# Patient Record
Sex: Male | Born: 1990 | Race: White | Hispanic: No | Marital: Single | Smoking: Never smoker
Health system: Southern US, Community
[De-identification: ages and names within clinical notes are randomized; demographics above are authoritative.]

## PROBLEM LIST (undated history)

## (undated) DIAGNOSIS — F909 Attention-deficit hyperactivity disorder, unspecified type: Secondary | ICD-10-CM

## (undated) DIAGNOSIS — F32A Depression, unspecified: Secondary | ICD-10-CM

---
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
## 2013-05-26 ENCOUNTER — Emergency Department: Payer: Self-pay | Admitting: Emergency Medicine

## 2014-03-14 ENCOUNTER — Ambulatory Visit: Payer: Self-pay | Admitting: Internal Medicine

## 2014-09-17 ENCOUNTER — Ambulatory Visit
Admit: 2014-09-17 | Disposition: A | Discharge status: Home / Self Care | Payer: Self-pay | Attending: Family Medicine | Admitting: Family Medicine

## 2020-07-29 ENCOUNTER — Other Ambulatory Visit: Payer: Self-pay

## 2020-07-29 ENCOUNTER — Ambulatory Visit (INDEPENDENT_AMBULATORY_CARE_PROVIDER_SITE_OTHER): Payer: Self-pay

## 2020-07-29 ENCOUNTER — Encounter: Payer: Self-pay | Admitting: Emergency Medicine

## 2020-07-29 ENCOUNTER — Ambulatory Visit
Admission: EM | Admit: 2020-07-29 | Discharge: 2020-07-29 | Disposition: A | Discharge status: Home / Self Care | Payer: Self-pay | Attending: Internal Medicine | Admitting: Internal Medicine

## 2020-07-29 DIAGNOSIS — R058 Other specified cough: Secondary | ICD-10-CM

## 2020-07-29 HISTORY — DX: Depression, unspecified: F32.A

## 2020-07-29 HISTORY — DX: Attention-deficit hyperactivity disorder, unspecified type: F90.9

## 2020-07-29 MED ORDER — IBUPROFEN 600 MG PO TABS
600.0000 mg | ORAL_TABLET | Freq: Four times a day (QID) | ORAL | 0 refills | Status: AC | PRN
Start: 2020-07-29 — End: ?

## 2020-07-29 MED ORDER — BENZONATATE 100 MG PO CAPS: 100.0000 mg | ORAL_CAPSULE | Freq: Three times a day (TID) | ORAL | 0 refills | Status: DC

## 2020-07-29 MED ORDER — PREDNISONE 10 MG PO TABS: 20.0000 mg | ORAL_TABLET | Freq: Every day | ORAL | 0 refills | Status: AC

## 2020-07-29 NOTE — ED Triage Notes (Signed)
Patient c/o cough and chest congestion for the past 4 weeks.  Patient states that he is getting more SOB.  Patient denies fevers.

## 2020-07-29 NOTE — ED Provider Notes (Signed)
[facility]    CSN: [id number] Arrival date & time: 07/29/20  1434      History   Chief Complaint Chief Complaint  Patient presents with  . Cough    HPI Trevor Grant is a 30 y.o. male comes to the urgent care with complaints of nonproductive cough, chest pain and some shortness of breath over the past 4 weeks.  Patient had an episode of upper respiratory infection symptoms about 4 weeks ago.  He tested negative for COVID-19 infection.  Since then patient has had persistent cough which is largely nonproductive.  Cough is associated with chest tightness and some shortness of breath on exertion.  No fever or chills.  He has not tried any over-the-counter medications.  He denies any wheezing.  HPI  Past Medical History:  Diagnosis Date  . ADHD   . Depression     There are no problems to display for this patient.   History reviewed. No pertinent surgical history.     Home Medications    Prior to Admission medications   Medication Sig Start Date End Date Taking? Authorizing Provider  benzonatate (TESSALON) 100 MG capsule Take 1 capsule (100 mg total) by mouth every 8 (eight) hours. 07/29/20  Yes Riverlyn Kizziah, Britta Mccreedy, MD  ibuprofen (ADVIL) 600 MG tablet Take 1 tablet (600 mg total) by mouth every 6 (six) hours as needed. 07/29/20  Yes Jillann Charette, Britta Mccreedy, MD  predniSONE (DELTASONE) 10 MG tablet Take 2 tablets (20 mg total) by mouth daily for 5 days. 07/29/20 08/03/20 Yes Tony Granquist, Britta Mccreedy, MD    Family History History reviewed. No pertinent family history.  Social History Social History   Tobacco Use  . Smoking status: Never Smoker  . Smokeless tobacco: Never Used  Vaping Use  . Vaping Use: Some days  Substance Use Topics  . Alcohol use: Yes  . Drug use: Never     Allergies   Vancomycin   Review of Systems Review of Systems  HENT: Positive for congestion.   Eyes: Negative.   Respiratory: Positive for cough and shortness of breath. Negative for chest  tightness and wheezing.   Gastrointestinal: Negative.   Genitourinary: Negative.   Musculoskeletal: Negative.   Neurological: Negative.      Physical Exam Triage Vital Signs ED Triage Vitals  Enc Vitals Group     BP 07/29/20 1446 126/84     Pulse --      Resp 07/29/20 1446 16     Temp 07/29/20 1446 98.8 F (37.1 C)     Temp Source 07/29/20 1446 Oral     SpO2 07/29/20 1446 99 %     Weight 07/29/20 1441 260 lb (117.9 kg)     Height 07/29/20 1441 6\' 2"  (1.88 m)     Head Circumference --      Peak Flow --      Pain Score 07/29/20 1441 7     Pain Loc --      Pain Edu? --      Excl. in GC? --    No data found.  Updated Vital Signs BP 126/84 (BP Location: Left Arm)   Temp 98.8 F (37.1 C) (Oral)   Resp 16   Ht 6\' 2"  (1.88 m)   Wt 117.9 kg   SpO2 99%   BMI 33.38 kg/m   Visual Acuity Right Eye Distance:   Left Eye Distance:   Bilateral Distance:    Right Eye Near:   Left Eye Near:  Bilateral Near:     Physical Exam Vitals and nursing note reviewed.  Constitutional:      General: He is not in acute distress.    Appearance: Normal appearance. He is not ill-appearing.  Cardiovascular:     Rate and Rhythm: Normal rate and regular rhythm.     Pulses: Normal pulses.     Heart sounds: Normal heart sounds.  Pulmonary:     Effort: Pulmonary effort is normal.     Breath sounds: Normal breath sounds.  Abdominal:     General: Bowel sounds are normal.     Palpations: Abdomen is soft.  Skin:    Capillary Refill: Capillary refill takes less than 2 seconds.  Neurological:     Mental Status: He is alert.      UC Treatments / Results  Labs (all labs ordered are listed, but only abnormal results are displayed) Labs Reviewed - No data to display  EKG   Radiology DG Chest 2 View  Result Date: 07/29/2020 CLINICAL DATA:  30 year old male with shortness of breath and cough. EXAM: CHEST - 2 VIEW COMPARISON:  None. FINDINGS: The heart size and mediastinal contours  are within normal limits. Both lungs are clear. The visualized skeletal structures are unremarkable. IMPRESSION: No active cardiopulmonary disease. Electronically Signed   By: Elgie Collard M.D.   On: 07/29/2020 15:06    Procedures Procedures (including critical care time)  Medications Ordered in UC Medications - No data to display  Initial Impression / Assessment and Plan / UC Course  I have reviewed the triage vital signs and the nursing notes.  Pertinent labs & imaging results that were available during my care of the patient were reviewed by me and considered in my medical decision making (see chart for details).     1.  Post viral cough syndrome: Chest x-ray is negative for acute lung infiltrate Prednisone 20 mg orally daily for 5 days Tessalon Perles as needed for cough Ibuprofen 600 mg every 6 hours as needed for pain Return to urgent care if symptoms worsen  Final Clinical Impressions(s) / UC Diagnoses   Final diagnoses:  Post-viral cough syndrome     Discharge Instructions     Please take medications as prescribed If you experience increased purulence of the cough, fever, chills or worsening shortness of breath please return to urgent care to be reevaluated.   ED Prescriptions    Medication Sig Dispense Auth. Provider   benzonatate (TESSALON) 100 MG capsule Take 1 capsule (100 mg total) by mouth every 8 (eight) hours. 21 capsule Afomia Blackley, Britta Mccreedy, MD   predniSONE (DELTASONE) 10 MG tablet Take 2 tablets (20 mg total) by mouth daily for 5 days. 10 tablet Kennley Schwandt, Britta Mccreedy, MD   ibuprofen (ADVIL) 600 MG tablet Take 1 tablet (600 mg total) by mouth every 6 (six) hours as needed. 30 tablet Nation Cradle, Britta Mccreedy, MD     PDMP not reviewed this encounter.   Merrilee Jansky, MD 07/29/20 Zollie Pee

## 2020-07-29 NOTE — Discharge Instructions (Signed)
Please take medications as prescribed If you experience increased purulence of the cough, fever, chills or worsening shortness of breath please return to urgent care to be reevaluated.

## 2020-08-11 ENCOUNTER — Other Ambulatory Visit: Payer: Self-pay

## 2020-08-11 ENCOUNTER — Ambulatory Visit
Admission: EM | Admit: 2020-08-11 | Discharge: 2020-08-11 | Disposition: A | Discharge status: Home / Self Care | Payer: Self-pay | Attending: Sports Medicine | Admitting: Sports Medicine

## 2020-08-11 DIAGNOSIS — R0602 Shortness of breath: Secondary | ICD-10-CM

## 2020-08-11 DIAGNOSIS — R058 Other specified cough: Secondary | ICD-10-CM

## 2020-08-11 DIAGNOSIS — R059 Cough, unspecified: Secondary | ICD-10-CM

## 2020-08-11 MED ORDER — ATROVENT HFA 17 MCG/ACT IN AERS
2.0000 | INHALATION_SPRAY | Freq: Four times a day (QID) | RESPIRATORY_TRACT | 0 refills | Status: AC | PRN
Start: 2020-08-11 — End: ?

## 2020-08-11 MED ORDER — PREDNISONE 10 MG PO TABS: ORAL_TABLET | ORAL | 0 refills | Status: DC

## 2020-08-11 NOTE — ED Triage Notes (Signed)
Pt c/o chronic cough since the middle of January. Pt is convinced he had COVID, despite multiple negative tests. Pt states cough has continued. Pt reports yesterday he walked up 3 flights of stairs and was out of breath, had to lay down, and could feel his pulse through his entire body. Pt states tessalon did not help his cough. Pt has not tried any OTC medications for his symptoms.

## 2020-08-11 NOTE — ED Provider Notes (Signed)
[facility]    CSN: [id number] Arrival date & time: 08/11/20  1224      History   Chief Complaint Chief Complaint  Patient presents with  . Cough    HPI Trevor Grant is a 30 y.o. male.   Patient is a pleasant 30 year old male who presents for evaluation of the above issue.  Patient was seen here 07/29/2020.  Please see that note for full details.  At that time diagnosed with a post viral/COVID cough and given a short steroid taper with ibuprofen and Tessalon Perles.  He said the steroids did help for the 5 days he was on it but the cough returned.  It is nonproductive.  He reports that he did have Covid exposure and although he never had a positive test he is convinced he did contract Covid.  No fever shakes chills.  He does have some shortness of breath with activity.  He feels that some of this might be deconditioning from the fact that he has not been doing any activity.  He has no shortness of breath at rest.  He works in a freezer as a Museum/gallery exhibitions officer.  He was also given Jerilynn Som and is not sure that has helped.  The only thing that really helped him was a steroids.  No chest pain.  No red flag signs or symptoms elicited on history.     Past Medical History:  Diagnosis Date  . ADHD   . Depression     There are no problems to display for this patient.   History reviewed. No pertinent surgical history.     Home Medications    Prior to Admission medications   Medication Sig Start Date End Date Taking? Authorizing Provider  ipratropium (ATROVENT HFA) 17 MCG/ACT inhaler Inhale 2 puffs into the lungs every 6 (six) hours as needed for wheezing. 08/11/20  Yes Delton See, MD  predniSONE (DELTASONE) 10 MG tablet 60 mg x 2 days, 50 mg x 2 days, 40 mg x 2 days, 30 mg x 2 days, 10 mg x 2 days 08/11/20  Yes Delton See, MD  benzonatate (TESSALON) 100 MG capsule Take 1 capsule (100 mg total) by mouth every 8 (eight) hours. 07/29/20   Merrilee Jansky, MD   ibuprofen (ADVIL) 600 MG tablet Take 1 tablet (600 mg total) by mouth every 6 (six) hours as needed. 07/29/20   Lamptey, Britta Mccreedy, MD    Family History No family history on file.  Social History Social History   Tobacco Use  . Smoking status: Never Smoker  . Smokeless tobacco: Never Used  Vaping Use  . Vaping Use: Some days  Substance Use Topics  . Alcohol use: Yes  . Drug use: Never     Allergies   Vancomycin   Review of Systems Review of Systems  Constitutional: Positive for activity change and fatigue. Negative for appetite change, chills, diaphoresis and fever.  HENT: Negative for congestion, ear discharge, ear pain, postnasal drip, rhinorrhea, sinus pressure, sinus pain, sneezing and sore throat.   Eyes: Negative.   Respiratory: Positive for cough and shortness of breath. Negative for apnea, chest tightness, wheezing and stridor.   Cardiovascular: Negative for chest pain and palpitations.  Gastrointestinal: Negative for abdominal pain, constipation, diarrhea, nausea and vomiting.  Genitourinary: Negative.   Musculoskeletal: Negative.  Negative for myalgias.  Skin: Negative.  Negative for rash.  Neurological: Negative for dizziness, syncope, light-headedness, numbness and headaches.  All other systems reviewed and are negative.  Physical Exam Triage Vital Signs ED Triage Vitals  Enc Vitals Group     BP 08/11/20 1257 126/82     Pulse Rate 08/11/20 1257 95     Resp 08/11/20 1257 18     Temp 08/11/20 1257 98.7 F (37.1 C)     Temp Source 08/11/20 1257 Oral     SpO2 08/11/20 1257 100 %     Weight 08/11/20 1255 259 lb (117.5 kg)     Height 08/11/20 1255 6\' 2"  (1.88 m)     Head Circumference --      Peak Flow --      Pain Score 08/11/20 1254 5     Pain Loc --      Pain Edu? --      Excl. in GC? --    No data found.  Updated Vital Signs BP 126/82 (BP Location: Left Arm)   Pulse 95   Temp 98.7 F (37.1 C) (Oral)   Resp 18   Ht 6\' 2"  (1.88 m)   Wt  117.5 kg   SpO2 100%   BMI 33.25 kg/m   Visual Acuity Right Eye Distance:   Left Eye Distance:   Bilateral Distance:    Right Eye Near:   Left Eye Near:    Bilateral Near:     Physical Exam Vitals and nursing note reviewed.  Constitutional:      General: He is not in acute distress.    Appearance: Normal appearance. He is not ill-appearing, toxic-appearing or diaphoretic.  HENT:     Head: Normocephalic and atraumatic.     Right Ear: Tympanic membrane normal.     Left Ear: Tympanic membrane normal.     Nose: Nose normal. No congestion or rhinorrhea.     Mouth/Throat:     Mouth: Mucous membranes are dry.     Pharynx: No oropharyngeal exudate or posterior oropharyngeal erythema.  Eyes:     General: No scleral icterus.       Right eye: No discharge.        Left eye: No discharge.     Extraocular Movements: Extraocular movements intact.     Conjunctiva/sclera: Conjunctivae normal.     Pupils: Pupils are equal, round, and reactive to light.  Neck:     Vascular: No carotid bruit.  Cardiovascular:     Rate and Rhythm: Normal rate and regular rhythm.     Pulses: Normal pulses.     Heart sounds: Normal heart sounds. No murmur heard. No friction rub. No gallop.   Pulmonary:     Effort: Pulmonary effort is normal. No respiratory distress.     Breath sounds: Normal breath sounds. No stridor. No wheezing, rhonchi or rales.     Comments: He is coughing throughout examination. Musculoskeletal:     Cervical back: Normal range of motion and neck supple. No rigidity or tenderness.  Lymphadenopathy:     Cervical: No cervical adenopathy.  Skin:    General: Skin is warm and dry.     Capillary Refill: Capillary refill takes less than 2 seconds.  Neurological:     General: No focal deficit present.     Mental Status: He is alert and oriented to person, place, and time.      UC Treatments / Results  Labs (all labs ordered are listed, but only abnormal results are displayed) Labs  Reviewed - No data to display  EKG   Radiology No results found.  Procedures Procedures (including critical care time)  Medications Ordered in UC Medications - No data to display  Initial Impression / Assessment and Plan / UC Course  I have reviewed the triage vital signs and the nursing notes.  Pertinent labs & imaging results that were available during my care of the patient were reviewed by me and considered in my medical decision making (see chart for details).  Clinical impression: Post viral cough.  He did have Covid exposure but has not tested positive.  Did respond to steroids but his cough returned after he completed his course.  He is complaining of some subjective shortness of breath with activity.  He is saturating at 100% on room air in the office.  His lungs are clear.  Treatment plan: 1.  The findings and treatment plan were discussed in detail with the patient.  Patient was in agreement. 2.  We will go ahead and treat him for 10 days with steroids. 3.  Considered an albuterol inhaler but given the fact that his symptoms responded so well to the steroid I felt it was better to give him an Atrovent inhaler. 4.  He does not feel the Tessalon Perles have helped much so I will not renew that.  He can use over-the-counter cough medication. 5.  I have encouraged him to make an appointment with his primary care physician to see whether or not he needs a pulmonology referral if his cough persists.  I did have a long discussion that it would be common in a post viral situation for him to have this cough.  Especially if it was Covid.  He voiced verbal understanding. 6.  Gave him a work note.  Once he is on the steroids I see no reason for him to not be able to return to work later today. 7.  Over-the-counter meds as needed, Tylenol or Motrin for fever discomfort.  Supportive care including plenty of rest and plenty of fluids. 8.  Follow-up here as needed.    Final Clinical  Impressions(s) / UC Diagnoses   Final diagnoses:  Cough  Post-viral cough syndrome  Shortness of breath     Discharge Instructions     Please take the steroids as prescribed. You should not need it while you are on the steroids but as you start to taper you may start to needed.  It will probably be beneficial after you finish the steroids to keep you from having a return cough. Please make an appointment with your primary care physician for follow-up.  You may need referral to a pulmonologist.  I hope you get the feeling better, Dr. Zachery Dauer    ED Prescriptions    Medication Sig Dispense Auth. Provider   predniSONE (DELTASONE) 10 MG tablet 60 mg x 2 days, 50 mg x 2 days, 40 mg x 2 days, 30 mg x 2 days, 10 mg x 2 days 38 tablet Delton See, MD   ipratropium (ATROVENT HFA) 17 MCG/ACT inhaler Inhale 2 puffs into the lungs every 6 (six) hours as needed for wheezing. 1 each Delton See, MD     PDMP not reviewed this encounter.   Delton See, MD 08/15/20 [phone]

## 2020-08-11 NOTE — Discharge Instructions (Addendum)
Please take the steroids as prescribed. You should not need it while you are on the steroids but as you start to taper you may start to needed.  It will probably be beneficial after you finish the steroids to keep you from having a return cough. Please make an appointment with your primary care physician for follow-up.  You may need referral to a pulmonologist.  I hope you get the feeling better, Dr. Zachery Dauer

## 2021-05-08 ENCOUNTER — Ambulatory Visit
Admission: EM | Admit: 2021-05-08 | Discharge: 2021-05-08 | Disposition: A | Discharge status: Home / Self Care | Payer: Self-pay | Attending: Emergency Medicine | Admitting: Emergency Medicine

## 2021-05-08 ENCOUNTER — Other Ambulatory Visit: Payer: Self-pay

## 2021-05-08 DIAGNOSIS — J069 Acute upper respiratory infection, unspecified: Secondary | ICD-10-CM

## 2021-05-08 MED ORDER — BENZONATATE 100 MG PO CAPS: 100.0000 mg | ORAL_CAPSULE | Freq: Three times a day (TID) | ORAL | 0 refills | Status: AC

## 2021-05-08 MED ORDER — PROMETHAZINE-DM 6.25-15 MG/5ML PO SYRP: 5.0000 mL | ORAL_SOLUTION | Freq: Four times a day (QID) | ORAL | 0 refills | Status: AC | PRN

## 2021-05-08 MED ORDER — ALBUTEROL SULFATE HFA 108 (90 BASE) MCG/ACT IN AERS: 2.0000 | INHALATION_SPRAY | RESPIRATORY_TRACT | 0 refills | Status: AC | PRN

## 2021-05-08 MED ORDER — PREDNISONE 20 MG PO TABS: 40.0000 mg | ORAL_TABLET | Freq: Every day | ORAL | 0 refills | Status: AC

## 2021-05-08 NOTE — Discharge Instructions (Addendum)
Beginning today you may take prednisone with food for 5 days, take medication prior to your day beginning and not before you were scheduled to go to bed  Use albuterol inhaler 2 puffs every 4 hours as needed for shortness of breath or wheezing  You may use Tessalon every 8 hours to help calm your coughing  You may use cough syrup every 6 hours to help calm your coughing, be mindful of this medication may have a sedative effect  Please follow-up with your primary care doctor, it is listed that you go to [facility] for care for evaluation of your lungs if you continue to have flareups

## 2021-05-08 NOTE — ED Provider Notes (Signed)
[facility]    CSN: [id number] Arrival date & time: 05/08/21  1309      History   Chief Complaint Chief Complaint  Patient presents with   Cough    HPI Trevor Grant is a 30 y.o. male.   Patient presents with nasal congestion, intermittent generalized headaches ,nonproductive cough and increased shortness of breath for 5 days.  Endorses that cough and shortness of breath has worsened.  Endorses that has had issues with breathing ever since COVID in March 2022.  Has attempted use of Mucinex and Advil, which provided some relief. Exposure to cigarette smoke.  Denies fever, chills, body aches, rhinorrhea, sore throat, wheezing, abdominal pain, nausea, vomiting, diarrhea.  No pertinent medical history.  Past Medical History:  Diagnosis Date   ADHD    Depression     There are no problems to display for this patient.   History reviewed. No pertinent surgical history.     Home Medications    Prior to Admission medications   Medication Sig Start Date End Date Taking? Authorizing Provider  benzonatate (TESSALON) 100 MG capsule Take 1 capsule (100 mg total) by mouth every 8 (eight) hours. 07/29/20   Merrilee Jansky, MD  ibuprofen (ADVIL) 600 MG tablet Take 1 tablet (600 mg total) by mouth every 6 (six) hours as needed. 07/29/20   Merrilee Jansky, MD  ipratropium (ATROVENT HFA) 17 MCG/ACT inhaler Inhale 2 puffs into the lungs every 6 (six) hours as needed for wheezing. 08/11/20   Delton See, MD  predniSONE (DELTASONE) 10 MG tablet 60 mg x 2 days, 50 mg x 2 days, 40 mg x 2 days, 30 mg x 2 days, 10 mg x 2 days 08/11/20   Delton See, MD    Family History History reviewed. No pertinent family history.  Social History Social History   Tobacco Use   Smoking status: Never   Smokeless tobacco: Never  Vaping Use   Vaping Use: Some days  Substance Use Topics   Alcohol use: Yes   Drug use: Never     Allergies   Vancomycin   Review of Systems Review  of Systems  Constitutional: Negative.   HENT:  Positive for congestion. Negative for dental problem, drooling, ear discharge, ear pain, facial swelling, hearing loss, mouth sores, nosebleeds, postnasal drip, rhinorrhea, sinus pressure, sinus pain, sneezing, sore throat, tinnitus, trouble swallowing and voice change.   Respiratory:  Positive for cough and shortness of breath. Negative for apnea, choking, chest tightness, wheezing and stridor.   Cardiovascular: Negative.   Gastrointestinal: Negative.   Neurological:  Positive for headaches. Negative for dizziness, tremors, seizures, syncope, facial asymmetry, speech difficulty, weakness, light-headedness and numbness.    Physical Exam Triage Vital Signs ED Triage Vitals  Enc Vitals Group     BP 05/08/21 1319 (!) 143/101     Pulse Rate 05/08/21 1319 88     Resp 05/08/21 1319 20     Temp 05/08/21 1319 99 F (37.2 C)     Temp Source 05/08/21 1319 Oral     SpO2 05/08/21 1319 99 %     Weight --      Height --      Head Circumference --      Peak Flow --      Pain Score 05/08/21 1317 6     Pain Loc --      Pain Edu? --      Excl. in GC? --    No data found.  Updated Vital Signs BP (!) 143/101 (BP Location: Right Arm)   Pulse 88   Temp 99 F (37.2 C) (Oral)   Resp 20   SpO2 99%   Visual Acuity Right Eye Distance:   Left Eye Distance:   Bilateral Distance:    Right Eye Near:   Left Eye Near:    Bilateral Near:     Physical Exam Constitutional:      Appearance: Normal appearance. He is normal weight.  HENT:     Right Ear: Tympanic membrane, ear canal and external ear normal.     Left Ear: Tympanic membrane, ear canal and external ear normal.     Nose: Nose normal.     Mouth/Throat:     Mouth: Mucous membranes are moist.     Pharynx: Oropharynx is clear.  Eyes:     Extraocular Movements: Extraocular movements intact.  Cardiovascular:     Rate and Rhythm: Normal rate and regular rhythm.     Pulses: Normal pulses.      Heart sounds: Normal heart sounds.  Pulmonary:     Effort: Pulmonary effort is normal.     Breath sounds: Wheezing present.  Skin:    General: Skin is warm and dry.  Neurological:     Mental Status: He is alert and oriented to person, place, and time. Mental status is at baseline.  Psychiatric:        Mood and Affect: Mood normal.        Behavior: Behavior normal.     UC Treatments / Results  Labs (all labs ordered are listed, but only abnormal results are displayed) Labs Reviewed - No data to display  EKG   Radiology No results found.  Procedures Procedures (including critical care time)  Medications Ordered in UC Medications - No data to display  Initial Impression / Assessment and Plan / UC Course  I have reviewed the triage vital signs and the nursing notes.  Pertinent labs & imaging results that were available during my care of the patient were reviewed by me and considered in my medical decision making (see chart for details).  Viral URI with cough  1.  Prednisone 40 mg daily for 5 days 2.  Tessalon 100 mg 3 times daily as needed 3.  Albuterol inhaler 108 mcg 2 puffs every 4 hours as needed 4.  Promethazine DM 6.25-15 mg / 5 mL every 6 hours as needed 5.  Recommended follow-up with primary care doctor for evaluation of lungs if symptoms persist or recur Final Clinical Impressions(s) / UC Diagnoses   Final diagnoses:  None   Discharge Instructions   None    ED Prescriptions   None    PDMP not reviewed this encounter.   Valinda Hoar, NP 05/08/21 1408

## 2021-05-08 NOTE — ED Triage Notes (Addendum)
Patient presents to Urgent Care with complaints of cough and SOB since weds. Pt states cough has worsened. He states fever Friday/Saturday has resolved. Treating symptoms with mucinex and aleve. Pt states he tested positive for covid in feb. Has had to use his inhaler since feb. He reports SOB has worsened.   Denies fever.

## 2021-06-02 IMAGING — CR DG CHEST 2V
2 series · 3 of 3 positions shown · non-contrast
Comparison: None.

CLINICAL DATA: 29-year-old male with shortness of breath and cough.

EXAM:
CHEST - 2 VIEW

[Series 1: chest pa · 0.14mm/px · 2 of 2 slices shown]
[im 1/2]
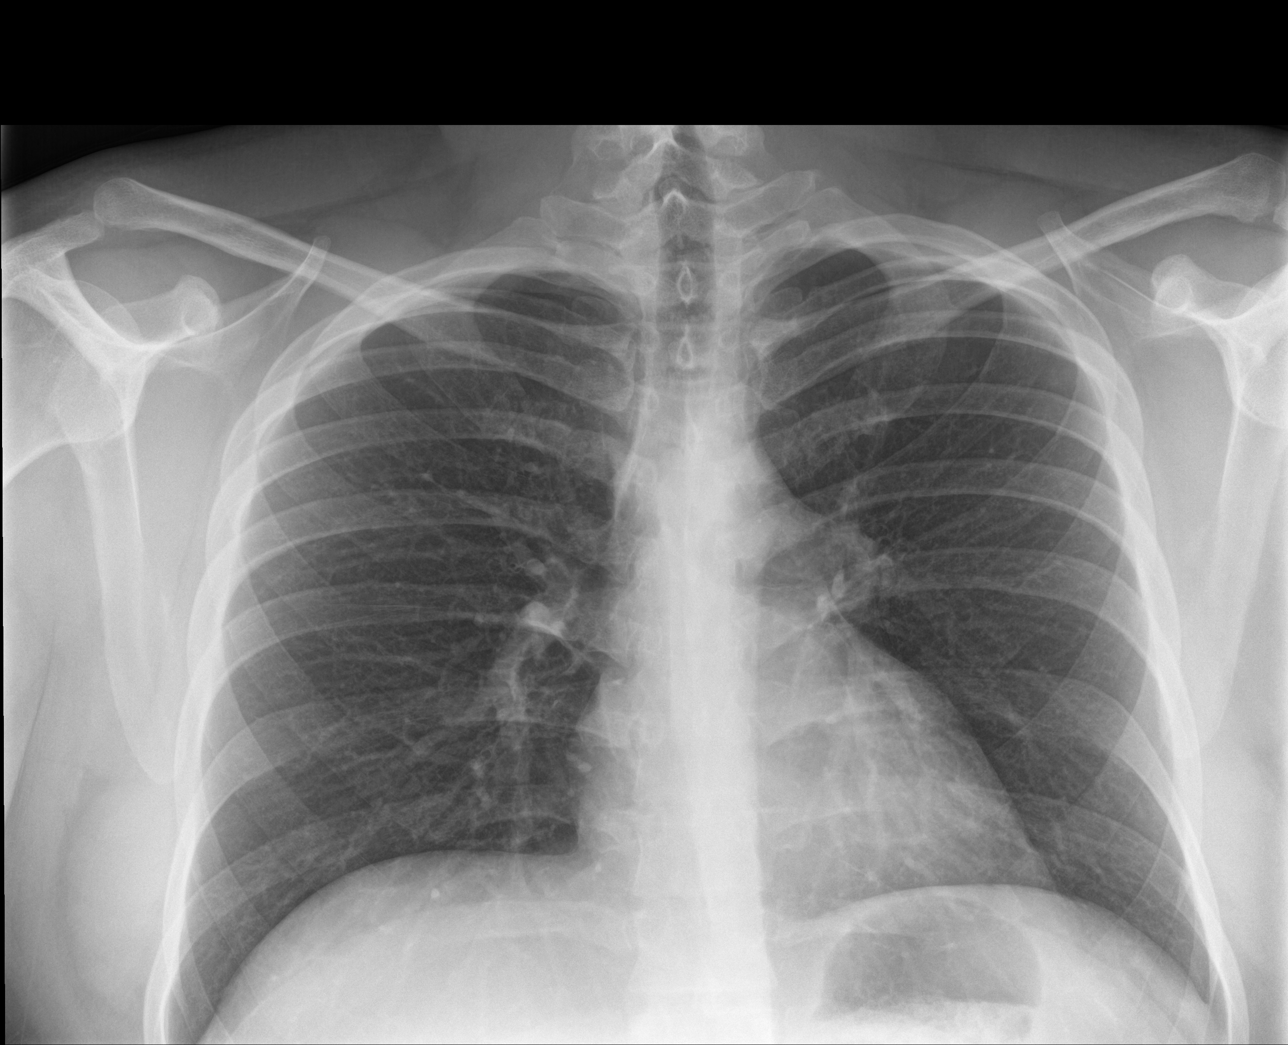
[im 2/2]
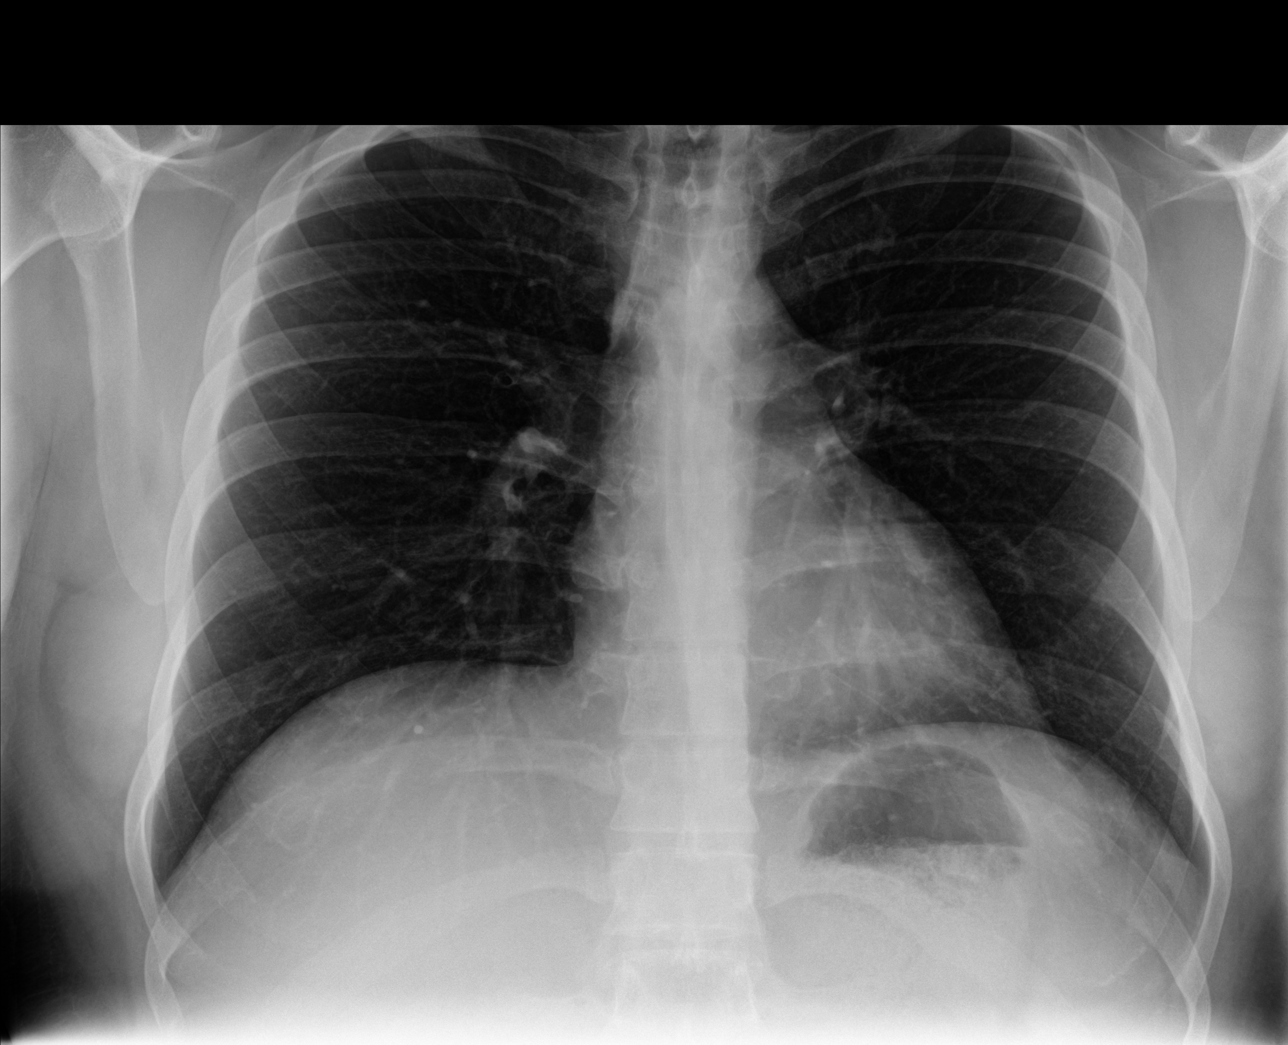

[chest lat]
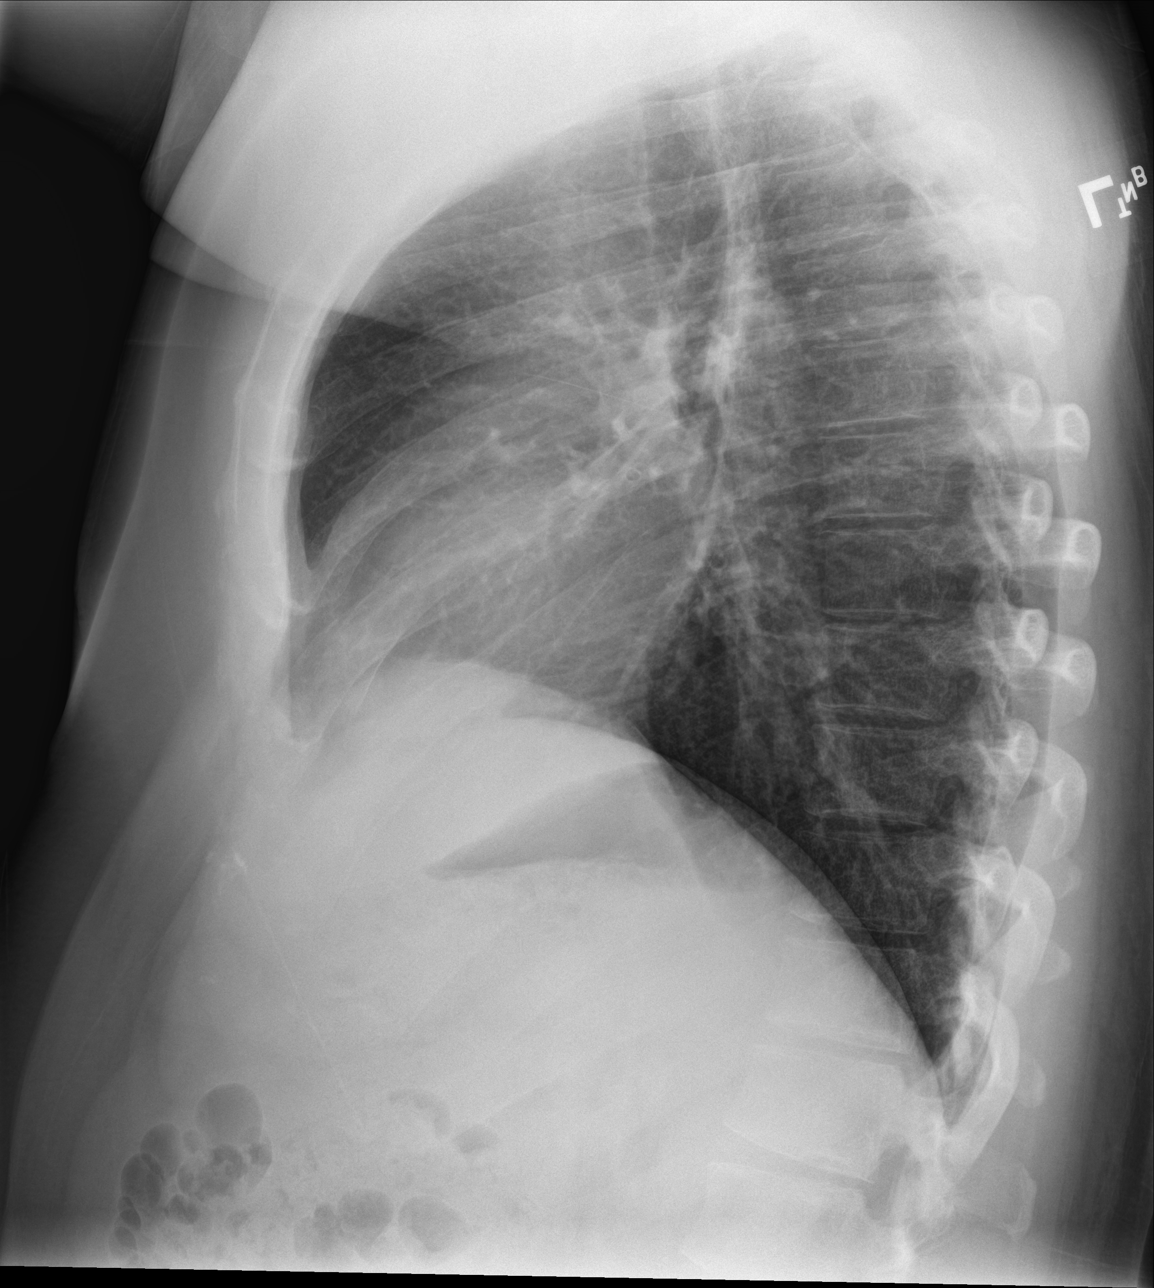

[3 of 3 positions shown; findings below may reference images not displayed]

FINDINGS: The heart size and mediastinal contours are within normal limits.
Both lungs are clear. The visualized skeletal structures are
unremarkable.
IMPRESSION: No active cardiopulmonary disease.
